# Patient Record
Sex: Female | Born: 1965 | Race: White | Hispanic: No | Marital: Married | Smoking: Never smoker
Health system: Southern US, Community
[De-identification: ages and names within clinical notes are randomized; demographics above are authoritative.]

## PROBLEM LIST (undated history)

## (undated) DIAGNOSIS — N6459 Other signs and symptoms in breast: Secondary | ICD-10-CM

## (undated) DIAGNOSIS — G709 Myoneural disorder, unspecified: Secondary | ICD-10-CM

## (undated) DIAGNOSIS — H33101 Unspecified retinoschisis, right eye: Secondary | ICD-10-CM

## (undated) DIAGNOSIS — D126 Benign neoplasm of colon, unspecified: Secondary | ICD-10-CM

## (undated) DIAGNOSIS — H43813 Vitreous degeneration, bilateral: Secondary | ICD-10-CM

## (undated) HISTORY — DX: Other signs and symptoms in breast: N64.59

## (undated) HISTORY — PX: OTHER SURGICAL HISTORY: SHX169

## (undated) HISTORY — DX: Benign neoplasm of colon, unspecified: D12.6

## (undated) HISTORY — DX: Myoneural disorder, unspecified: G70.9

## (undated) HISTORY — DX: Vitreous degeneration, bilateral: H43.813

## (undated) HISTORY — DX: Unspecified retinoschisis, right eye: H33.101

---

## 2015-11-23 ENCOUNTER — Other Ambulatory Visit: Payer: Self-pay | Admitting: Unknown Physician Specialty

## 2015-11-23 DIAGNOSIS — N6321 Unspecified lump in the left breast, upper outer quadrant: Secondary | ICD-10-CM

## 2015-11-26 ENCOUNTER — Other Ambulatory Visit: Payer: Self-pay | Admitting: *Deleted

## 2015-11-26 ENCOUNTER — Inpatient Hospital Stay
Admission: RE | Admit: 2015-11-26 | Discharge: 2015-11-26 | Disposition: A | Discharge status: Home / Self Care | Payer: Self-pay | Source: Ambulatory Visit | Attending: *Deleted | Admitting: *Deleted

## 2015-11-26 DIAGNOSIS — Z9289 Personal history of other medical treatment: Secondary | ICD-10-CM

## 2015-12-05 ENCOUNTER — Other Ambulatory Visit: Payer: Self-pay

## 2015-12-05 ENCOUNTER — Ambulatory Visit: Payer: Self-pay

## 2015-12-10 ENCOUNTER — Ambulatory Visit
Admission: RE | Admit: 2015-12-10 | Discharge: 2015-12-10 | Disposition: A | Discharge status: Home / Self Care | Payer: BLUE CROSS/BLUE SHIELD | Source: Ambulatory Visit | Attending: Unknown Physician Specialty | Admitting: Unknown Physician Specialty

## 2015-12-10 DIAGNOSIS — N6002 Solitary cyst of left breast: Secondary | ICD-10-CM | POA: Diagnosis not present

## 2015-12-10 DIAGNOSIS — N6321 Unspecified lump in the left breast, upper outer quadrant: Secondary | ICD-10-CM

## 2015-12-10 DIAGNOSIS — N63 Unspecified lump in breast: Secondary | ICD-10-CM | POA: Diagnosis present

## 2017-03-24 ENCOUNTER — Other Ambulatory Visit: Payer: Self-pay | Admitting: Family Medicine

## 2017-03-24 DIAGNOSIS — N6459 Other signs and symptoms in breast: Secondary | ICD-10-CM

## 2017-03-31 ENCOUNTER — Encounter (HOSPITAL_COMMUNITY): Payer: Self-pay

## 2017-03-31 ENCOUNTER — Ambulatory Visit
Admission: RE | Admit: 2017-03-31 | Discharge: 2017-03-31 | Disposition: A | Discharge status: Home / Self Care | Payer: 59 | Source: Ambulatory Visit | Attending: Family Medicine | Admitting: Family Medicine

## 2017-03-31 DIAGNOSIS — N6322 Unspecified lump in the left breast, upper inner quadrant: Secondary | ICD-10-CM | POA: Diagnosis not present

## 2017-03-31 DIAGNOSIS — N6459 Other signs and symptoms in breast: Secondary | ICD-10-CM | POA: Insufficient documentation

## 2017-03-31 DIAGNOSIS — N6001 Solitary cyst of right breast: Secondary | ICD-10-CM | POA: Insufficient documentation

## 2017-11-16 IMAGING — MG MM DIGITAL DIAGNOSTIC BILAT W/ TOMO W/ CAD
8 of 17 series · 8 of 40 positions shown · non-contrast
Comparison: Previous exam(s).

CLINICAL DATA: Bilateral nipple inversion and milky nipple
discharge.

EXAM:
2D DIGITAL DIAGNOSTIC BILATERAL MAMMOGRAM WITH CAD AND ADJUNCT TOMO
ULTRASOUND BILATERAL BREAST

[R MLO (1 of 2)]
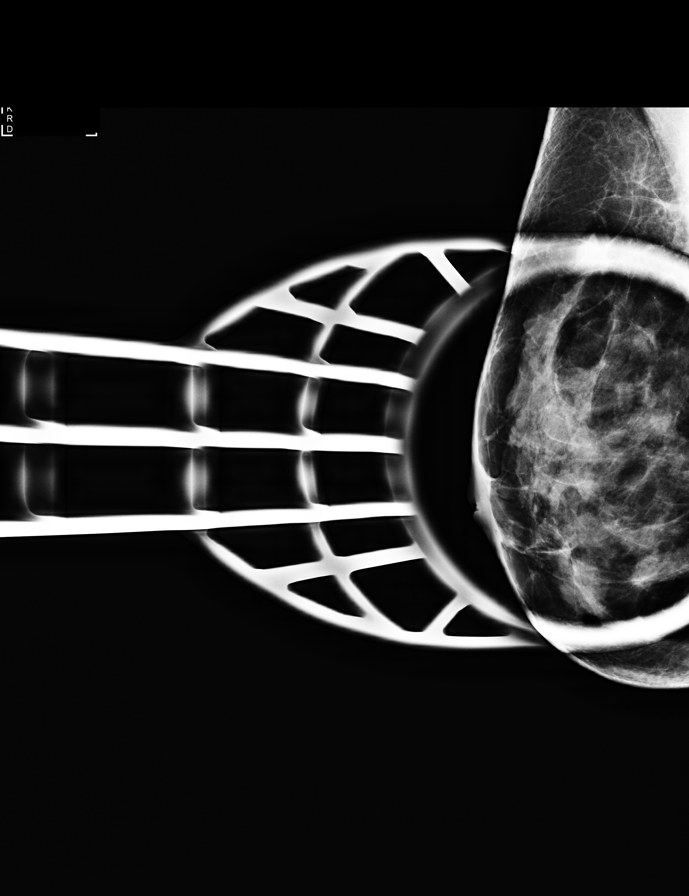

[L MLO]
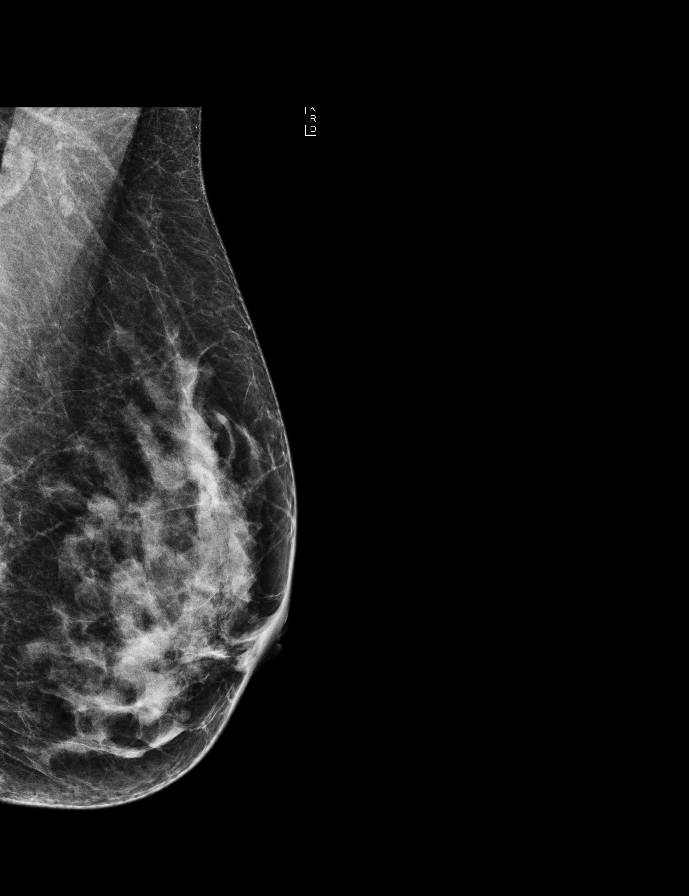

[R MLO synth-2D]
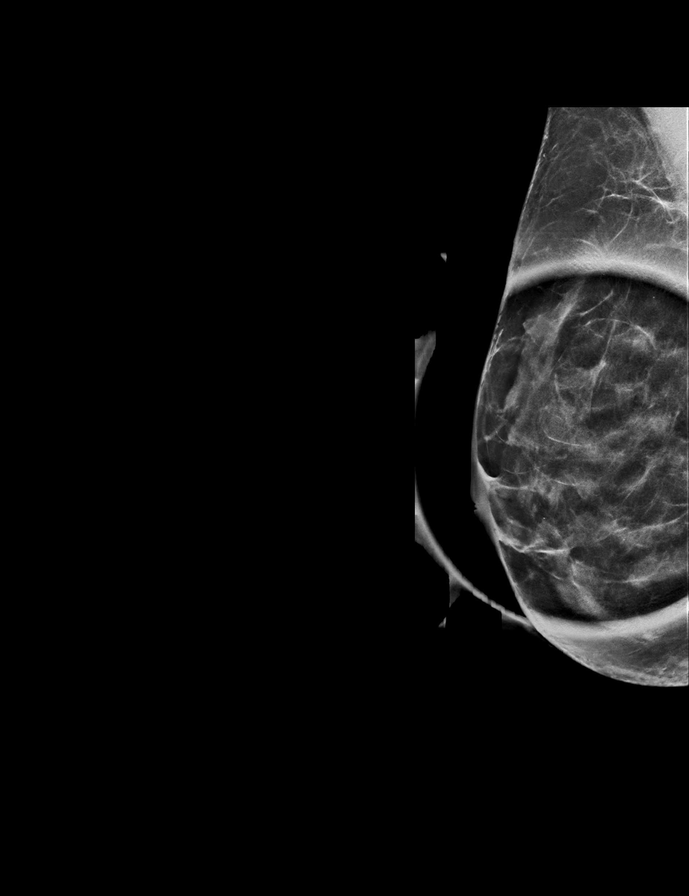

[R CC]
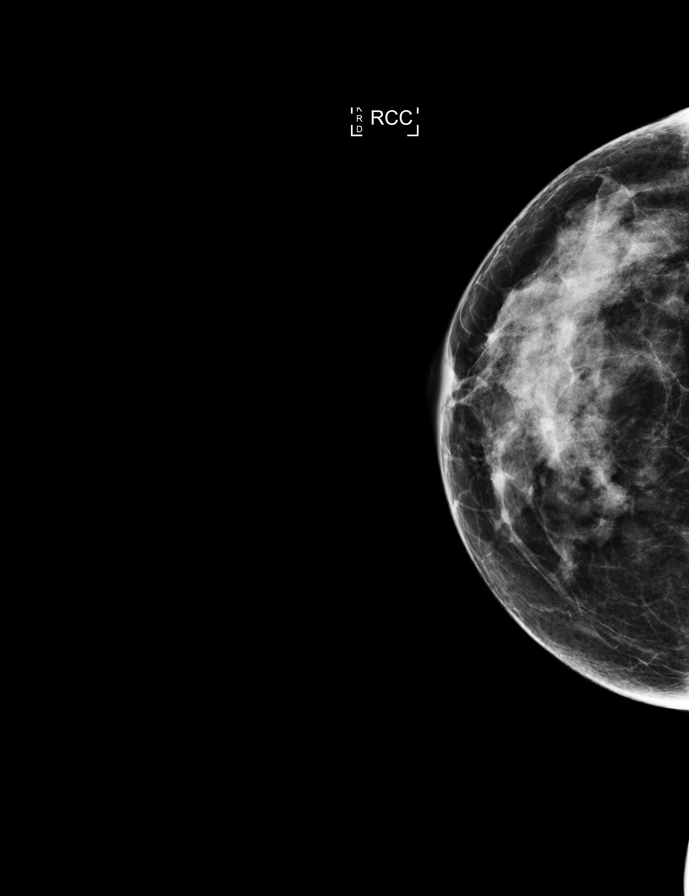

[R MLO (2 of 2)]
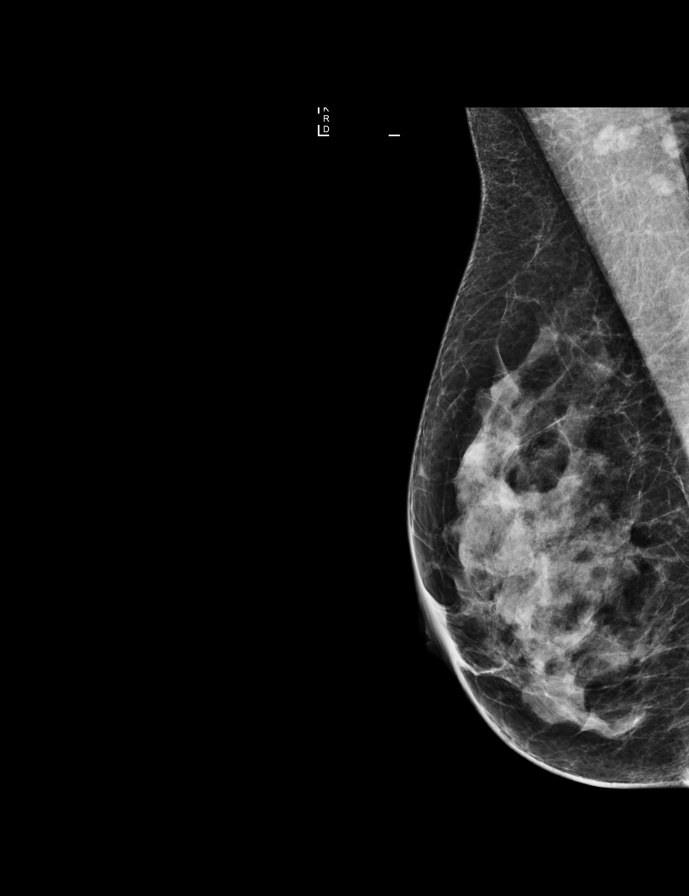

[L MLO synth-2D (1 of 2)]
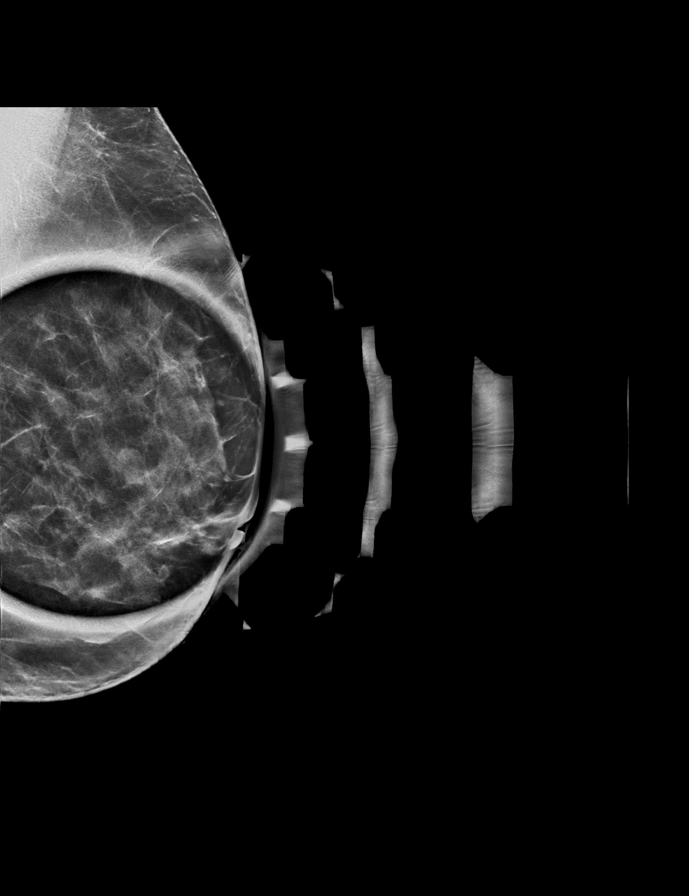

[L CC]
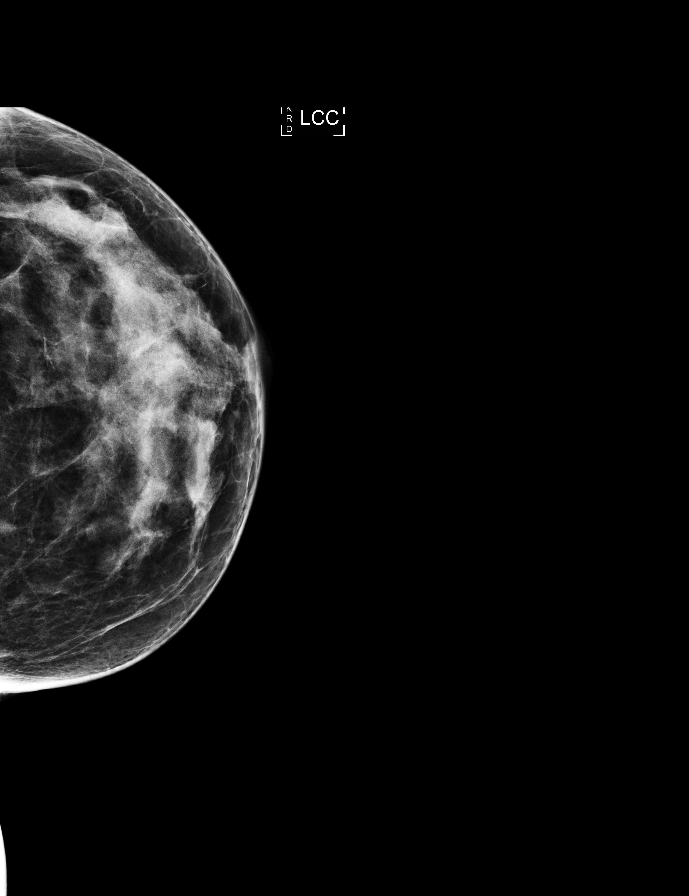

[L MLO synth-2D (2 of 2)]
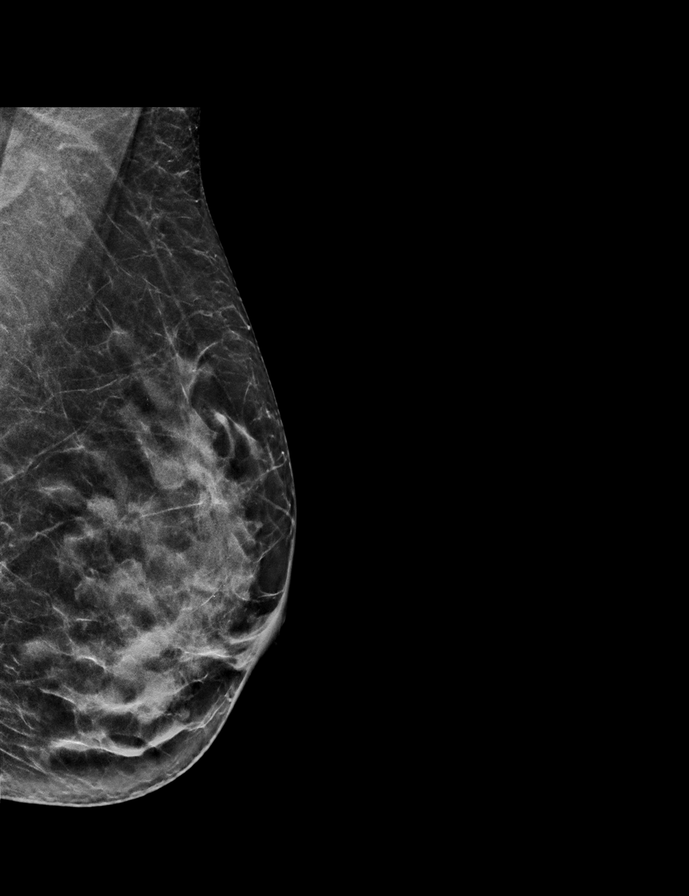

[8 of 40 positions shown; findings below may reference images not displayed]

ACR Breast Density Category c: The breast tissue is heterogeneously
dense, which may obscure small masses.
FINDINGS: Mammographically, there stable to decreased in size isodense to
breast parenchyma masses in the lower inner quadrant and outer
central breast in the right breast. Similarly there are
benign-appearing isodense to breast parenchyma masses in the left
outer breast.

Mammographic images were processed with CAD.

On physical exam, no suspicious masses are palpated.

Targeted right breast ultrasound is performed, showing no suspicious
masses or shadowing lesions. Benign-appearing some mildly
complicated cysts are seen in the right breast 430 o'clock, 8
o'clock and 9 o'clock. The largest of these at 9 o'clock 3 cm from
the nipple measures 8 mm. No duct ectasia or intraductal masses.

Targeted left breast ultrasound is performed, showing
benign-appearing some mildly complicated cysts in the left 3 and 4
o'clock breast, measuring up to 1 cm. In the left 3 o'clock 1 cm
from the nipple there is a hypoechoic mostly circumscribed nodule
measuring 0.6 x 0.3 x 0.6 cm. This may represent a degenerating cyst
or a fibrocystic nodule. No duct ectasia or intraductal masses.
IMPRESSION: No mammographic or sonographic evidence of malignancy in the right
breast. Right breast benign-appearing cysts.

Left breast 3 o'clock 1 cm from the nipple probably benign 6 mm
nodule. Otherwise, no mammographic or sonographic evidence of
malignancy in the left breast.

RECOMMENDATION:
Surgical consultation with consideration for breast MRI is
recommended because of patient's clinical history of nipple
inversion and nipple discharge.

Otherwise, six-month follow-up with left breast mammogram and
ultrasound is recommended for the 3 o'clock probably benign 6 mm
nodule.

I have discussed the findings and recommendations with the patient.
Results were also provided in writing at the conclusion of the
visit. If applicable, a reminder letter will be sent to the patient
regarding the next appointment.

BI-RADS CATEGORY  3: Probably benign.

## 2018-11-26 LAB — HM COLONOSCOPY

## 2021-05-02 ENCOUNTER — Encounter (INDEPENDENT_AMBULATORY_CARE_PROVIDER_SITE_OTHER): Payer: 59 | Admitting: Ophthalmology

## 2021-06-03 ENCOUNTER — Encounter (INDEPENDENT_AMBULATORY_CARE_PROVIDER_SITE_OTHER): Payer: Self-pay | Admitting: Ophthalmology

## 2021-06-03 ENCOUNTER — Ambulatory Visit (INDEPENDENT_AMBULATORY_CARE_PROVIDER_SITE_OTHER): Payer: 59 | Admitting: Ophthalmology

## 2021-06-03 ENCOUNTER — Other Ambulatory Visit: Payer: Self-pay

## 2021-06-03 DIAGNOSIS — H43812 Vitreous degeneration, left eye: Secondary | ICD-10-CM | POA: Diagnosis not present

## 2021-06-03 DIAGNOSIS — H33101 Unspecified retinoschisis, right eye: Secondary | ICD-10-CM

## 2021-06-03 DIAGNOSIS — H43811 Vitreous degeneration, right eye: Secondary | ICD-10-CM | POA: Diagnosis not present

## 2021-06-03 DIAGNOSIS — H35371 Puckering of macula, right eye: Secondary | ICD-10-CM

## 2021-06-03 NOTE — Progress Notes (Signed)
06/03/2021     CHIEF COMPLAINT Patient presents for  Chief Complaint  Patient presents with   Retina Follow Up      HISTORY OF PRESENT ILLNESS: Victoria Santiago is a 55 y.o. female who presents to the clinic today for:   HPI     Retina Follow Up   Patient presents with  PVD.  This started 2 years ago.  Severity is mild.  Duration of 2 years.  Since onset it is stable.        Comments   2 yr fu ou oct Patient states vision is stable and unchanged since last visit. Denies any new floaters or FOL.       Last edited by [name], COA on 06/03/2021  8:04 AM.      Referring physician: [name], MD [address] [place],  [place] [address]  HISTORICAL INFORMATION:   Selected notes from the MEDICAL RECORD NUMBER       CURRENT MEDICATIONS: No current outpatient medications on file. (Ophthalmic Drugs)   No current facility-administered medications for this visit. (Ophthalmic Drugs)   No current outpatient medications on file. (Other)   No current facility-administered medications for this visit. (Other)      REVIEW OF SYSTEMS:    ALLERGIES Allergies  Allergen Reactions   Morphine And Related Hives and Itching    PAST MEDICAL HISTORY History reviewed. No pertinent past medical history. History reviewed. No pertinent surgical history.  FAMILY HISTORY Family History  Problem Relation Age of Onset   Breast cancer Paternal Grandmother 72    SOCIAL HISTORY Social History   Tobacco Use   Smoking status: Never    Passive exposure: Never   Smokeless tobacco: Never         OPHTHALMIC EXAM:  Base Eye Exam     Visual Acuity (ETDRS)       Right Left   Dist [place] 20/20 -2 20/25 -1         Tonometry (Tonopen, 8:10 AM)       Right Left   Pressure 12 12         Pupils       Pupils Shape React APD   Right PERRL Round Brisk None   Left PERRL Round Brisk None         Visual Fields (Counting fingers)       Left Right    Full  Full         Extraocular Movement       Right Left    Full Full         Neuro/Psych     Oriented x3: Yes   Mood/Affect: Normal         Dilation     Both eyes: 1.0% Mydriacyl, 2.5% Phenylephrine @ 8:10 AM           Slit Lamp and Fundus Exam     External Exam       Right Left   External Normal Normal         Slit Lamp Exam       Right Left   Lids/Lashes Normal Normal   Conjunctiva/Sclera White and quiet White and quiet   Cornea Clear Clear   Anterior Chamber Deep and quiet Deep and quiet   Iris Round and reactive Round and reactive   Lens Centered posterior chamber intraocular lens, Open posterior capsule Centered posterior chamber intraocular lens, Open posterior capsule   Anterior Vitreous Normal  Normal         Fundus Exam       Right Left   Posterior Vitreous Posterior vitreous detachment Posterior vitreous detachment   Disc Normal Normal   C/D Ratio 0.15 0.15   Macula Epiretinal membrane Epiretinal membrane   Vessels Normal Normal   Periphery Normal Normal            IMAGING AND PROCEDURES  Imaging and Procedures for 06/03/21  OCT, Retina - OU - Both Eyes       Right Eye Quality was good. Scan locations included subfoveal. Central Foveal Thickness: 271. Findings include macular pucker.   Left Eye Quality was good. Scan locations included subfoveal. Central Foveal Thickness: 278.   Notes Incidental posterior vitreous detachment OU  OD with resolved foveal macular schisis as compared to 2018.  This is coincident with release of vitreal macular adhesion and now into PVD OD.  OS similar length developed minor foveal macular schisis as of 2019 this resolved as of 2020.  And still stable left eye as well.              ASSESSMENT/PLAN:  Macular retinoschisis of right eye Condition resolved OD.  Continue to observe  Right epiretinal membrane OD, nondistorting, present as a result of previous PVD and release of  vitreomacular adhesion triggering prior foveal macular schisis  Observe at present  Posterior vitreous detachment of right eye Reviewed, accounts for floaters      ICD-10-CM   1. Macular retinoschisis of right eye  H33.101 OCT, Retina - OU - Both Eyes    2. Posterior vitreous detachment of right eye  H43.811 OCT, Retina - OU - Both Eyes    3. Posterior vitreous detachment, left eye  H43.812 OCT, Retina - OU - Both Eyes    4. Right epiretinal membrane  H35.371       1.  OU, foveal macular schisis now resolved with spontaneous release of VMA to now PVD OU.  2.  Minor epiretinal membrane OD nondistorted and follow-up with Dr. [name]  3.  Ophthalmic Meds Ordered this visit:  No orders of the defined types were placed in this encounter.      Return in about 2 years (around 06/04/2023) for DILATE OU, OCT.  There are no Patient Instructions on file for this visit.   Explained the diagnoses, plan, and follow up with the patient and they expressed understanding.  Patient expressed understanding of the importance of proper follow up care.   Alford [name] M.D. Diseases & Surgery of the Retina and Vitreous [facility] 06/03/21     Abbreviations: M myopia (nearsighted); A astigmatism; H hyperopia (farsighted); P presbyopia; Mrx spectacle prescription;  CTL contact lenses; OD right eye; OS left eye; OU both eyes  XT exotropia; ET esotropia; PEK punctate epithelial keratitis; PEE punctate epithelial erosions; DES dry eye syndrome; MGD meibomian gland dysfunction; ATs artificial tears; PFAT's preservative free artificial tears; NSC nuclear sclerotic cataract; PSC posterior subcapsular cataract; ERM epi-retinal membrane; PVD posterior vitreous detachment; RD retinal detachment; DM diabetes mellitus; DR diabetic retinopathy; NPDR non-proliferative diabetic retinopathy; PDR proliferative diabetic retinopathy; CSME clinically significant macular edema; DME diabetic  macular edema; dbh dot blot hemorrhages; CWS cotton wool spot; POAG primary open angle glaucoma; C/D cup-to-disc ratio; HVF humphrey visual field; GVF goldmann visual field; OCT optical coherence tomography; IOP intraocular pressure; BRVO Branch retinal vein occlusion; CRVO central retinal vein occlusion; CRAO central retinal artery occlusion; BRAO branch retinal  artery occlusion; RT retinal tear; SB scleral buckle; PPV pars plana vitrectomy; VH Vitreous hemorrhage; PRP panretinal laser photocoagulation; IVK intravitreal kenalog; VMT vitreomacular traction; MH Macular hole;  NVD neovascularization of the disc; NVE neovascularization elsewhere; AREDS age related eye disease study; ARMD age related macular degeneration; POAG primary open angle glaucoma; EBMD epithelial/anterior basement membrane dystrophy; ACIOL anterior chamber intraocular lens; IOL intraocular lens; PCIOL posterior chamber intraocular lens; Phaco/IOL phacoemulsification with intraocular lens placement; Electra photorefractive keratectomy; LASIK laser assisted in situ keratomileusis; HTN hypertension; DM diabetes mellitus; COPD chronic obstructive pulmonary disease

## 2021-06-03 NOTE — Assessment & Plan Note (Signed)
Reviewed, accounts for floaters

## 2021-06-03 NOTE — Assessment & Plan Note (Signed)
Condition resolved OD.  Continue to observe

## 2021-06-03 NOTE — Assessment & Plan Note (Signed)
OD, nondistorting, present as a result of previous PVD and release of vitreomacular adhesion triggering prior foveal macular schisis  Observe at present

## 2022-12-04 ENCOUNTER — Encounter (INDEPENDENT_AMBULATORY_CARE_PROVIDER_SITE_OTHER): Payer: 59 | Admitting: Ophthalmology

## 2023-05-22 ENCOUNTER — Telehealth: Payer: Self-pay | Admitting: Gastroenterology

## 2023-05-22 NOTE — Telephone Encounter (Signed)
Awaiting records from Dr Charm Barges.   Procedure from 2020.  Provider no longer does procedure.   Requesting Dr Chales Abrahams.

## 2023-08-20 ENCOUNTER — Telehealth: Payer: Self-pay | Admitting: Gastroenterology

## 2023-08-20 NOTE — Telephone Encounter (Signed)
Hi Dr. Chales Abrahams,    Patient is wishing to transfer her care specifically over to you. Patient is wishing to be seen for a colonoscopy. Patient last had a colonoscopy in 2020 with Dr. Charm Barges. Patient is requesting to transfer care due to Dr. Charm Barges no longer preforming procedures. Patient's previous records are in [facility] media for you to review and advise on scheduling.    Thank you.

## 2023-08-20 NOTE — Telephone Encounter (Signed)
OK for APP clinic RG

## 2023-08-21 ENCOUNTER — Encounter: Payer: Self-pay | Admitting: Physician Assistant

## 2023-11-12 ENCOUNTER — Ambulatory Visit: Payer: PRIVATE HEALTH INSURANCE | Admitting: Physician Assistant

## 2023-11-12 ENCOUNTER — Encounter: Payer: Self-pay | Admitting: Physician Assistant

## 2023-11-12 VITALS — BP 118/64 | HR 71 | Ht 60.0 in | Wt 127.0 lb

## 2023-11-12 DIAGNOSIS — Z01818 Encounter for other preprocedural examination: Secondary | ICD-10-CM

## 2023-11-12 DIAGNOSIS — Z860101 Personal history of adenomatous and serrated colon polyps: Secondary | ICD-10-CM | POA: Diagnosis not present

## 2023-11-12 DIAGNOSIS — Z800 Family history of malignant neoplasm of digestive organs: Secondary | ICD-10-CM

## 2023-11-12 DIAGNOSIS — Z860100 Personal history of colon polyps, unspecified: Secondary | ICD-10-CM

## 2023-11-12 MED ORDER — SUTAB 1479-225-188 MG PO TABS: ORAL_TABLET | ORAL | 0 refills | Status: DC

## 2023-11-12 MED ORDER — ONDANSETRON HCL 4 MG PO TABS: 4.0000 mg | ORAL_TABLET | Freq: Four times a day (QID) | ORAL | 0 refills | Status: AC | PRN

## 2023-11-12 NOTE — Patient Instructions (Signed)
 You have been scheduled for a colonoscopy. Please follow written instructions given to you at your visit today.   We have sent zofran  to your pharmacy to use prior to each prepping time to help with nausea.  If you use inhalers (even only as needed), please bring them with you on the day of your procedure.  DO NOT TAKE 7 DAYS PRIOR TO TEST- Trulicity (dulaglutide) Ozempic, Wegovy (semaglutide) Mounjaro (tirzepatide) Bydureon Bcise (exanatide extended release)  DO NOT TAKE 1 DAY PRIOR TO YOUR TEST Rybelsus (semaglutide) Adlyxin (lixisenatide) Victoza (liraglutide) Byetta (exanatide) ___________________________________________________________________________  Rosine will receive your bowel preparation through [facility], which ensures the lowest copay and home delivery, with outreach via text or call from an [phone] number. Please respond promptly to avoid rescheduling of your procedure. If you are interested in alternative options or have any questions regarding your prep, please contact them at [phone] ____________________________________________________________________________  Your Provider Has Sent Your Bowel Prep Regimen To [facility]   [facility] will contact you to verify your information and collect your copay, if applicable. Enjoy the comfort of your home while your prescription is mailed to you, FREE of any shipping charges.   [facility] accepts all major insurance benefits and applies discounts & coupons.  Have additional questions?   Chat: [email or web address] Call: [phone] Email: [email or web address] NCPDP: [id number]  How will [facility] contact you?  With a Welcome phone call,  a Welcome text and a checkout link in text form.  Texts you receive from [phone] Are NOT Spam.  *To set up delivery, you must complete the checkout process via link or speak to one of the patient care representatives. If [facility] is unable to reach you, your  prescription may be delayed.  To avoid long hold times on the phone, you may also utilize the secure chat feature on the [facility] website to request that they call you back for transaction completion or to expedite your concerns.  I appreciate the opportunity to care for you. Amy Esterwood PA-C

## 2023-11-12 NOTE — Progress Notes (Signed)
 Subjective:    Patient ID: Victoria Santiago, female    DOB: 1966-02-12, 58 y.o.   MRN: [id number]  HPI  Victoria Santiago is a pleasant 58 year old white female, new to GI today self-referred to discuss follow-up colonoscopy.  She had prior colonoscopy done by Dr. Towana in [place] in February 2020 for screening, and had removal of 2 polyps, both 4 to 5 mm in size and both were tubular adenomas.  She was indicated for 5-year interval follow-up. She also has family history of colon cancer in her paternal uncle diagnosed in his 8s, and her brother has history of multiple polyps.  Her father passed away in his early 86s from coronary artery disease so did not ever have colonoscopy.  She is in generally good health and has no current GI complaints.  Appetite has been good, weight has been stable, no current issues with abdominal pain or discomfort, no changes in bowel habits, no melena or hematochezia. She did bring copies of her most recent labs; January 2025 BUN 13/creatinine 0.88 LFTs within normal limits Cholesterol 233/triglycerides 79/HDL 64 TSH 4.87 Hemoglobin A1c 5.8 WBC 5.3/hemoglobin 14.4/hematocrit 42.4/MCV 85/platelets 295    Review of Systems Pertinent positive and negative review of systems were noted in the above HPI section.  All other review of systems was otherwise negative.   Outpatient Encounter Medications as of 11/12/2023  Medication Sig   estradiol (ESTRACE) 0.1 MG/GM vaginal cream Place 1 Applicatorful vaginally at bedtime.   fexofenadine (ALLEGRA) 180 MG tablet Take by mouth.   ondansetron  (ZOFRAN ) 4 MG tablet Take 1 tablet (4 mg total) by mouth every 6 (six) hours as needed for nausea.   progesterone (PROMETRIUM) 100 MG capsule Take 175 mg by mouth daily.   Sodium Sulfate-Mag Sulfate-KCl (SUTAB ) 1479-225-188 MG TABS Use as directed for colonoscopy. MANUFACTURER CODES!! BIN: [id number] PCN: CN GROUP: [id number] MEMBER ID: [id number];MLW AS SECONDARY INSURANCE ;NO PRIOR AUTHORIZATION    No facility-administered encounter medications on file as of 11/12/2023.   Allergies  Allergen Reactions   Morphine And Codeine Hives and Itching   Patient Active Problem List   Diagnosis Date Noted   Posterior vitreous detachment of right eye 06/03/2021   Posterior vitreous detachment, left eye 06/03/2021   Macular retinoschisis of right eye 06/03/2021   Right epiretinal membrane 06/03/2021   Social History   Socioeconomic History   Marital status: Married    Spouse name: Not on file   Number of children: 3   Years of education: Not on file   Highest education level: Not on file  Occupational History   Occupation: admin  Tobacco Use   Smoking status: Never    Passive exposure: Never   Smokeless tobacco: Never  Vaping Use   Vaping status: Never Used  Substance and Sexual Activity   Alcohol use: Not on file    Comment: very seldom   Drug use: Not on file   Sexual activity: Not on file  Other Topics Concern   Not on file  Social History Narrative   Not on file   Social Drivers of Health   Financial Resource Strain: Not on file  Food Insecurity: Not on file  Transportation Needs: Not on file  Physical Activity: Not on file  Stress: Not on file  Social Connections: Not on file  Intimate Partner Violence: Not on file    Victoria Santiago's family history includes Breast cancer (age of onset: 37) in her paternal grandmother; Colon cancer in her paternal uncle.  Objective:    Vitals:   11/12/23 1351  BP: 118/64  Pulse: 71    Physical Exam Well-developed well-nourished older WF in no acute distress.  Height, Weight,127 BMI24.8  HEENT; nontraumatic normocephalic, EOMI, PE R LA, sclera anicteric. Oropharynx;not examined Neck; supple, no JVD Cardiovascular; regular rate and rhythm with S1-S2, no murmur rub or gallop Pulmonary; Clear bilaterally Abdomen; soft, nontender, nondistended, no palpable mass or hepatosplenomegaly, bowel sounds are active Rectal;not  done Skin; benign exam, no jaundice rash or appreciable lesions Extremities; no clubbing cyanosis or edema skin warm and dry Neuro/Psych; alert and oriented x4, grossly nonfocal mood and affect appropriate        Assessment & Plan:   #35 58 year old white female with personal history of adenomatous colon polyps here to discuss follow-up colonoscopy at a 5-year interval. Last colonoscopy done February 2020/Dr. Butler/[place] with two 4 to 5 mm tubular adenomas found.  #2 family history of colon cancer in her paternal uncle diagnosed in his 27s, patient's father deceased in his early 38s so never had colonoscopy. Patient's brother has had multiple polyps.  #3 status post tubal ligation and C-section  Plan; Patient requests to be established with Dr. Charlanne.  Patient will be scheduled for colonoscopy with Dr. Charlanne.  Procedure was discussed in detail with the patient including indications risks and benefits and she is agreeable to proceed. Sutab  prep Zofran  4 mg 1 p.o. every 6 hours #2 sent to her pharmacy to use around the time of the prep if needed for nausea.  Given her family history she may be best served by at least every 5-year Colonoscopy moving forward.  Victoria Santiago S Tamakia Porto PA-C 11/12/2023   Cc: Ofilia Lamar CROME, MD

## 2024-01-08 ENCOUNTER — Encounter: Payer: Self-pay | Admitting: Gastroenterology

## 2024-01-15 ENCOUNTER — Encounter: Payer: Self-pay | Admitting: Gastroenterology

## 2024-01-15 ENCOUNTER — Ambulatory Visit: Payer: PRIVATE HEALTH INSURANCE | Admitting: Gastroenterology

## 2024-01-15 VITALS — BP 115/67 | HR 59 | Temp 98.2°F | Resp 12 | Ht 60.0 in | Wt 127.0 lb

## 2024-01-15 DIAGNOSIS — D123 Benign neoplasm of transverse colon: Secondary | ICD-10-CM | POA: Diagnosis not present

## 2024-01-15 DIAGNOSIS — Z860100 Personal history of colon polyps, unspecified: Secondary | ICD-10-CM

## 2024-01-15 DIAGNOSIS — K5730 Diverticulosis of large intestine without perforation or abscess without bleeding: Secondary | ICD-10-CM

## 2024-01-15 DIAGNOSIS — K640 First degree hemorrhoids: Secondary | ICD-10-CM

## 2024-01-15 DIAGNOSIS — Z1211 Encounter for screening for malignant neoplasm of colon: Secondary | ICD-10-CM | POA: Diagnosis present

## 2024-01-15 DIAGNOSIS — D128 Benign neoplasm of rectum: Secondary | ICD-10-CM

## 2024-01-15 DIAGNOSIS — Z860101 Personal history of adenomatous and serrated colon polyps: Secondary | ICD-10-CM | POA: Diagnosis not present

## 2024-01-15 MED ORDER — SODIUM CHLORIDE 0.9 % IV SOLN
500.0000 mL | INTRAVENOUS | Status: DC
Start: 2024-01-15 — End: 2024-01-15

## 2024-01-15 NOTE — Progress Notes (Signed)
 To pacu, VSS. Report to RN.tb

## 2024-01-15 NOTE — Op Note (Signed)
 [facility] Patient Name: Victoria Santiago Procedure Date: 01/15/2024 2:38 PM MRN: [id number] Endoscopist: Lynann Bologna , MD, [id number] Age: 58 Referring MD:  Date of Birth: [birth year] Gender: Female Account #: [id number] Procedure:                Colonoscopy Indications:              High risk colon cancer surveillance: Personal                            history of colonic polyps Medicines:                Monitored Anesthesia Care Procedure:                Pre-Anesthesia Assessment:                           - Prior to the procedure, a History and Physical                            was performed, and patient medications and                            allergies were reviewed. The patient's tolerance of                            previous anesthesia was also reviewed. The risks                            and benefits of the procedure and the sedation                            options and risks were discussed with the patient.                            All questions were answered, and informed consent                            was obtained. Prior Anticoagulants: The patient has                            taken no anticoagulant or antiplatelet agents. ASA                            Grade Assessment: II - A patient with mild systemic                            disease. After reviewing the risks and benefits,                            the patient was deemed in satisfactory condition to                            undergo the procedure.  After obtaining informed consent, the colonoscope                            was passed under direct vision. Throughout the                            procedure, the patient's blood pressure, pulse, and                            oxygen saturations were monitored continuously. The                            Olympus Scope [id number] was introduced through the                            anus and advanced to the the cecum,  identified by                            appendiceal orifice and ileocecal valve. The                            colonoscopy was performed without difficulty. The                            patient tolerated the procedure well. The quality                            of the bowel preparation was good. The ileocecal                            valve, appendiceal orifice, and rectum were                            photographed. Scope In: 2:43:14 PM Scope Out: 3:04:54 PM Scope Withdrawal Time: 0 hours 14 minutes 33 seconds  Total Procedure Duration: 0 hours 21 minutes 40 seconds  Findings:                 Two sessile polyps were found in the proximal                            transverse colon. The polyps were 2 to 6 mm in                            size. These polyps were removed with a cold snare.                            Resection and retrieval were complete.                           An 8 mm polyp was found in the mid rectum. The                            polyp was sessile. The  polyp was removed with a                            cold snare. Resection and retrieval were complete.                           A few small-mouthed diverticula were found in the                            sigmoid colon and rare in ascending colon/px                            transverse colon.                           Non-bleeding internal hemorrhoids were found during                            retroflexion. The hemorrhoids were small and Grade                            I (internal hemorrhoids that do not prolapse).                           The exam was otherwise without abnormality on                            direct and retroflexion views. Complications:            No immediate complications. Estimated Blood Loss:     Estimated blood loss: none. Impression:               - Two 2 to 6 mm polyps in the proximal transverse                            colon, removed with a cold snare. Resected and                             retrieved.                           - One 8 mm polyp in the mid rectum, removed with a                            cold snare. Resected and retrieved.                           - Mild colonic diverticulosis.                           - Non-bleeding internal hemorrhoids.                           - The examination was otherwise normal on direct  and retroflexion views. Recommendation:           - Patient has a contact number available for                            emergencies. The signs and symptoms of potential                            delayed complications were discussed with the                            patient. Return to normal activities tomorrow.                            Written discharge instructions were provided to the                            patient.                           - Resume previous diet.                           - Continue present medications.                           - Await pathology results.                           - No aspirin, ibuprofen, naproxen, or other                            non-steroidal anti-inflammatory drugs for 5 days                            after polyp removal.                           - Repeat colonoscopy for surveillance based on                            pathology results.                           - The findings and recommendations were discussed                            with Dr Loleta Chance. Lynann Bologna, MD 01/15/2024 3:10:26 PM This report has been signed electronically.

## 2024-01-15 NOTE — Patient Instructions (Signed)
-  Handout on polyps provided -await pathology results -repeat colonoscopy for surveillance recommended. Date to be determined when pathology result become available   -Continue present medications   -NO aspirin, ibuprofen, naproxen or other non-steroidal anti-inflammatory drugs for 5 days  YOU HAD AN ENDOSCOPIC PROCEDURE TODAY AT THE [facility]:   Refer to the procedure report that was given to you for any specific questions about what was found during the examination.  If the procedure report does not answer your questions, please call your gastroenterologist to clarify.  If you requested that your care partner not be given the details of your procedure findings, then the procedure report has been included in a sealed envelope for you to review at your convenience later.  YOU SHOULD EXPECT: Some feelings of bloating in the abdomen. Passage of more gas than usual.  Walking can help get rid of the air that was put into your GI tract during the procedure and reduce the bloating. If you had a lower endoscopy (such as a colonoscopy or flexible sigmoidoscopy) you may notice spotting of blood in your stool or on the toilet paper. If you underwent a bowel prep for your procedure, you may not have a normal bowel movement for a few days.  Please Note:  You might notice some irritation and congestion in your nose or some drainage.  This is from the oxygen used during your procedure.  There is no need for concern and it should clear up in a day or so.  SYMPTOMS TO REPORT IMMEDIATELY:  Following lower endoscopy (colonoscopy or flexible sigmoidoscopy):  Excessive amounts of blood in the stool  Significant tenderness or worsening of abdominal pains  Swelling of the abdomen that is new, acute  Fever of 100F or higher  For urgent or emergent issues, a gastroenterologist can be reached at any hour by calling [phone]. Do not use MyChart messaging for urgent concerns.    DIET:  We do  recommend a small meal at first, but then you may proceed to your regular diet.  Drink plenty of fluids but you should avoid alcoholic beverages for 24 hours.  ACTIVITY:  You should plan to take it easy for the rest of today and you should NOT DRIVE or use heavy machinery until tomorrow (because of the sedation medicines used during the test).    FOLLOW UP: Our staff will call the number listed on your records the next business day following your procedure.  We will call around 7:15- 8:00 am to check on you and address any questions or concerns that you may have regarding the information given to you following your procedure. If we do not reach you, we will leave a message.     If any biopsies were taken you will be contacted by phone or by letter within the next 1-3 weeks.  Please call us at [phone] if you have not heard about the biopsies in 3 weeks.    SIGNATURES/CONFIDENTIALITY: You and/or your care partner have signed paperwork which will be entered into your electronic medical record.  These signatures attest to the fact that that the information above on your After Visit Summary has been reviewed and is understood.  Full responsibility of the confidentiality of this discharge information lies with you and/or your care-partner.

## 2024-01-15 NOTE — Progress Notes (Signed)
 Pt's states she had some spinal/back issues recently (01/08/2024).

## 2024-01-15 NOTE — Progress Notes (Signed)
 Called to room to assist during endoscopic procedure.  Patient ID and intended procedure confirmed with present staff. Received instructions for my participation in the procedure from the performing physician.

## 2024-01-15 NOTE — Progress Notes (Signed)
 [facility] History and Physical   Primary Care Physician:  [name], MD   Reason for Procedure:   H/O Polyps  Plan:     colonoscopy     HPI: Victoria Santiago is a 58 y.o. 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 Authorizing Provider  cyclobenzaprine (FLEXERIL) 10 MG tablet Take 10 mg by mouth every 8 (eight) hours as needed. 12/20/23  Yes [provider]  estradiol (ESTRACE) 0.1 MG/GM vaginal cream Place 1 Applicatorful vaginally at bedtime.   Yes [provider]  fexofenadine (ALLEGRA) 180 MG tablet Take by mouth. 10/03/20  Yes [provider]  gabapentin (NEURONTIN) 300 MG capsule Take 300 mg by mouth at bedtime. 01/01/24  Yes [provider]  ketorolac (TORADOL) 10 MG tablet Take 10 mg by mouth 4 (four) times daily as needed. 12/18/23  Yes [provider]  ondansetron (ZOFRAN) 4 MG tablet Take 1 tablet (4 mg total) by mouth every 6 (six) hours as needed for nausea. 11/12/23  Yes Esterwood, Amy S, PA-C  oseltamivir (TAMIFLU) 75 MG capsule Take 75 mg by mouth 2 (two) times daily. 01/07/24  Yes [provider]  oxyCODONE-acetaminophen (PERCOCET) 10-325 MG tablet Take by mouth. 12/20/23  Yes [provider]  predniSONE (DELTASONE) 50 MG tablet Take 50 mg by mouth daily. 12/20/23  Yes [provider]  progesterone (PROMETRIUM) 100 MG capsule Take 175 mg by mouth daily.   Yes [provider]  tamsulosin (FLOMAX) 0.4 MG CAPS capsule Take 0.4 mg by mouth 2 (two) times daily. 12/18/23  Yes [provider]     Current Outpatient Medications  Medication Sig Dispense Refill   cyclobenzaprine (FLEXERIL) 10 MG tablet Take 10 mg by mouth every 8 (eight) hours as needed.     estradiol (ESTRACE) 0.1 MG/GM vaginal cream Place 1 Applicatorful vaginally at bedtime.     fexofenadine (ALLEGRA) 180 MG tablet Take by mouth.     gabapentin (NEURONTIN) 300 MG capsule Take 300 mg by mouth at bedtime.     ketorolac (TORADOL) 10 MG tablet Take 10 mg by mouth 4 (four) times daily as needed.     ondansetron (ZOFRAN) 4 MG tablet Take 1 tablet (4 mg total) by mouth every 6 (six) hours as needed for nausea. 2 tablet 0   oseltamivir (TAMIFLU) 75 MG capsule Take 75 mg by mouth 2 (two) times daily.     oxyCODONE-acetaminophen (PERCOCET) 10-325 MG tablet Take by mouth.     predniSONE (DELTASONE) 50 MG tablet Take 50 mg by mouth daily.     progesterone (PROMETRIUM) 100 MG capsule Take 175 mg by mouth daily.     tamsulosin (FLOMAX) 0.4 MG CAPS capsule Take 0.4 mg by mouth 2 (two) times daily.     Current Facility-Administered Medications  Medication Dose Route Frequency Provider Last Rate Last Admin   0.9 %  sodium chloride infusion  500 mL Intravenous Continuous Lynann Bologna, MD        Allergies  as of 01/15/2024 - Review Complete 01/15/2024  Allergen Reaction Noted   Morphine and codeine Hives and Itching 06/03/2021    Family History  Problem Relation Age of Onset   Breast cancer Paternal Grandmother 106   Colon cancer Paternal Uncle     Social History   Socioeconomic History   Marital status: Married    Spouse name: Not on file   Number of children: 3   Years of education: Not on file   Highest education level: Not on file  Occupational History   Occupation: admin  Tobacco Use   Smoking status: Never    Passive exposure: Never   Smokeless tobacco: Never  Vaping Use   Vaping status: Never Used  Substance and Sexual Activity   Alcohol use: Not on file    Comment: very seldom   Drug use: Never    Sexual activity: Not on file  Other Topics Concern   Not on file  Social History Narrative   Not on file   Social Drivers of Health   Financial Resource Strain: Not on file  Food Insecurity: Not on file  Transportation Needs: Not on file  Physical Activity: Not on file  Stress: Not on file  Social Connections: Not on file  Intimate Partner Violence: Not on file    Review of Systems: Positive for none All other review of systems negative except as mentioned in the HPI.  Physical Exam: Vital signs in last 24 hours: @VSRANGES @   General:   Alert,  Well-developed, well-nourished, pleasant and cooperative in NAD Lungs:  Clear throughout to auscultation.   Heart:  Regular rate and rhythm; no murmurs, clicks, rubs,  or gallops. Abdomen:  Soft, nontender and nondistended. Normal bowel sounds.   Neuro/Psych:  Alert and cooperative. Normal mood and affect. A and O x 3    No significant changes were identified.  The patient continues to be an appropriate candidate for the planned procedure and anesthesia.   Edman Circle, MD. [facility] 01/15/2024 2:36 PM@

## 2024-01-19 ENCOUNTER — Telehealth: Payer: Self-pay | Admitting: *Deleted

## 2024-01-19 NOTE — Telephone Encounter (Signed)
  Follow up Call-     01/15/2024    1:49 PM  Call back number  Post procedure Call Back phone  # [phone]  Permission to leave phone message Yes     Patient questions:  Do you have a fever, pain , or abdominal swelling? No. Pain Score  0 *  Have you tolerated food without any problems? Yes.    Have you been able to return to your normal activities? Yes.    Do you have any questions about your discharge instructions: Diet   No. Medications  No. Follow up visit  No.  Do you have questions or concerns about your Care? No.  Actions: * If pain score is 4 or above: No action needed, pain <4.

## 2024-01-20 LAB — SURGICAL PATHOLOGY

## 2024-01-21 ENCOUNTER — Encounter: Payer: Self-pay | Admitting: Gastroenterology
